# Patient Record
Sex: Female | Born: 1975 | Race: White | Hispanic: No | Marital: Single | Smoking: Current every day smoker
Health system: Southern US, Community
[De-identification: ages and names within clinical notes are randomized; demographics above are authoritative.]

## PROBLEM LIST (undated history)

## (undated) DIAGNOSIS — M5030 Other cervical disc degeneration, unspecified cervical region: Secondary | ICD-10-CM

## (undated) DIAGNOSIS — K760 Fatty (change of) liver, not elsewhere classified: Secondary | ICD-10-CM

## (undated) DIAGNOSIS — F32A Depression, unspecified: Secondary | ICD-10-CM

## (undated) DIAGNOSIS — E785 Hyperlipidemia, unspecified: Secondary | ICD-10-CM

## (undated) DIAGNOSIS — R7303 Prediabetes: Secondary | ICD-10-CM

## (undated) DIAGNOSIS — F419 Anxiety disorder, unspecified: Secondary | ICD-10-CM

## (undated) DIAGNOSIS — F259 Schizoaffective disorder, unspecified: Secondary | ICD-10-CM

## (undated) DIAGNOSIS — F319 Bipolar disorder, unspecified: Secondary | ICD-10-CM

## (undated) DIAGNOSIS — Z62819 Personal history of unspecified abuse in childhood: Secondary | ICD-10-CM

## (undated) DIAGNOSIS — F17200 Nicotine dependence, unspecified, uncomplicated: Secondary | ICD-10-CM

## (undated) HISTORY — PX: TONSILLECTOMY: SUR1361

## (undated) HISTORY — PX: ABDOMINAL HYSTERECTOMY: SHX81

## (undated) HISTORY — PX: WISDOM TOOTH EXTRACTION: SHX21

## (undated) HISTORY — PX: KNEE ARTHROSCOPY: SHX127

## (undated) HISTORY — PX: DILATION AND CURETTAGE OF UTERUS: SHX78

## (undated) HISTORY — PX: TRIGGER FINGER RELEASE: SHX641

---

## 2002-01-01 ENCOUNTER — Emergency Department (HOSPITAL_COMMUNITY): Admission: EM | Admit: 2002-01-01 | Discharge: 2002-01-01 | Payer: Self-pay | Admitting: Emergency Medicine

## 2009-10-19 ENCOUNTER — Encounter: Payer: Self-pay | Admitting: Obstetrics and Gynecology

## 2009-10-19 ENCOUNTER — Ambulatory Visit: Payer: Self-pay | Admitting: Obstetrics & Gynecology

## 2009-10-19 LAB — CONVERTED CEMR LAB
HCV Ab: NEGATIVE
Hemoglobin: 14.9 g/dL (ref 12.0–15.0)
Hepatitis B Surface Ag: NEGATIVE
MCHC: 33.9 g/dL (ref 30.0–36.0)
Pap Smear: NEGATIVE
RBC: 4.91 M/uL (ref 3.87–5.11)
WBC: 8.9 10*3/uL (ref 4.0–10.5)

## 2009-10-20 ENCOUNTER — Ambulatory Visit (HOSPITAL_COMMUNITY): Admission: RE | Admit: 2009-10-20 | Discharge: 2009-10-20 | Payer: Self-pay | Admitting: Obstetrics & Gynecology

## 2010-04-25 ENCOUNTER — Emergency Department (HOSPITAL_COMMUNITY): Admission: EM | Admit: 2010-04-25 | Discharge: 2010-04-25 | Payer: Self-pay | Admitting: Emergency Medicine

## 2010-04-25 ENCOUNTER — Ambulatory Visit (HOSPITAL_COMMUNITY): Admission: RE | Admit: 2010-04-25 | Discharge: 2010-04-25 | Payer: Self-pay | Admitting: Obstetrics & Gynecology

## 2010-05-09 ENCOUNTER — Encounter: Admission: RE | Admit: 2010-05-09 | Discharge: 2010-05-09 | Payer: Self-pay | Admitting: Obstetrics & Gynecology

## 2010-11-05 LAB — POCT URINALYSIS DIP (DEVICE)
Glucose, UA: NEGATIVE mg/dL
Nitrite: NEGATIVE
Protein, ur: NEGATIVE mg/dL
Specific Gravity, Urine: 1.02 (ref 1.005–1.030)
Urobilinogen, UA: 0.2 mg/dL (ref 0.0–1.0)

## 2011-12-17 IMAGING — US US TRANSVAGINAL NON-OB
1 series · 13 of 25 positions shown · non-contrast
Comparison: 09/04/2009

CLINICAL DATA: Follow-up complex ovarian cyst seen on prior exam.

TRANSVAGINAL ULTRASOUND OF PELVIS
TECHNIQUE: Transvaginal ultrasound examination of the pelvis was
performed including evaluation of the uterus, ovaries, adnexal
regions, and pelvic cul-de-sac.

[Series 1: us transvaginal non-ob · 0.12mm/px · 48 acquisitions, 13 frames shown]
[im 1/48]
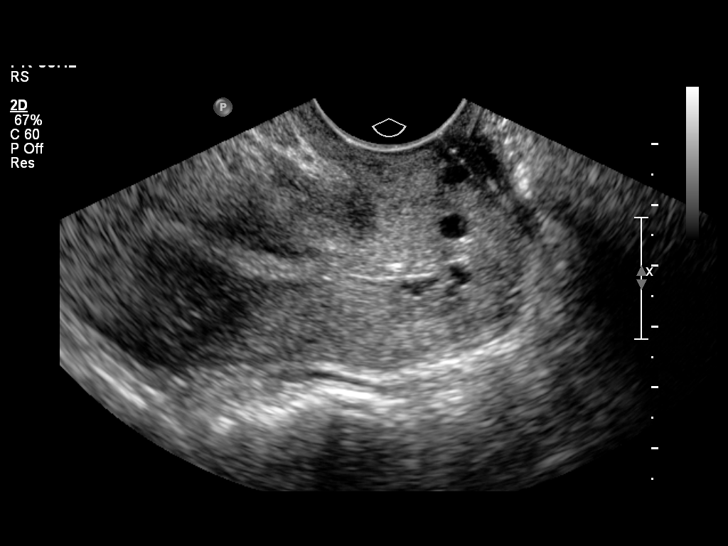
[im 4/48]
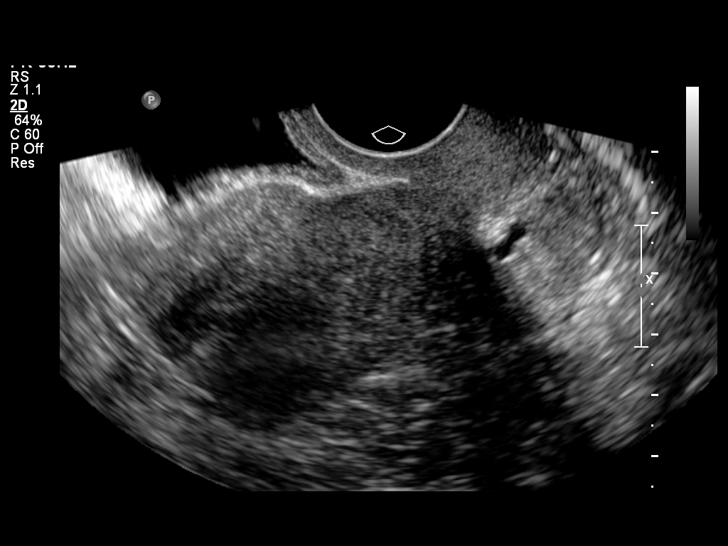
[im 8/48]
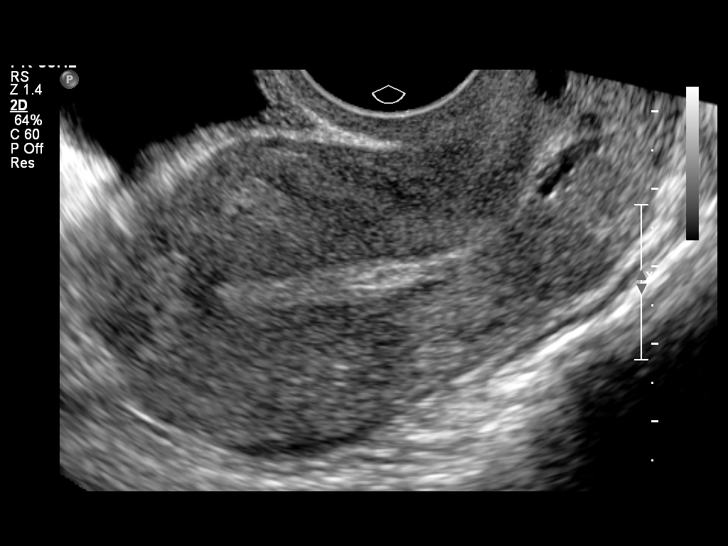
[im 12/48]
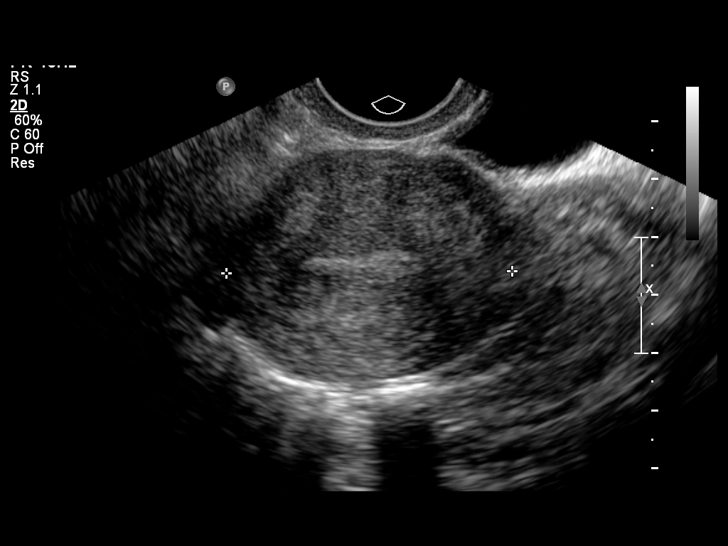
[im 16/48]
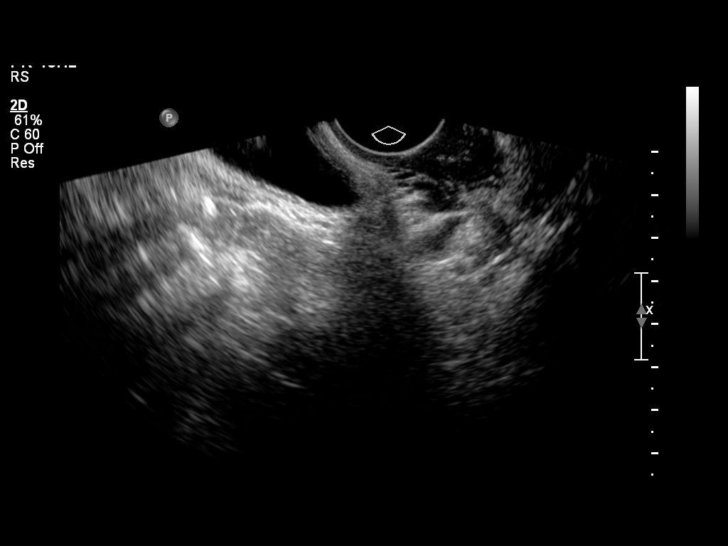
[im 20/48]
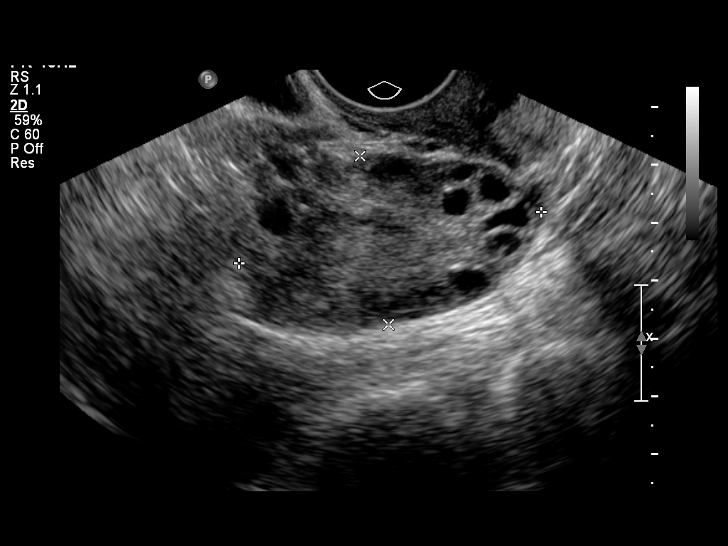
[im 24/48]
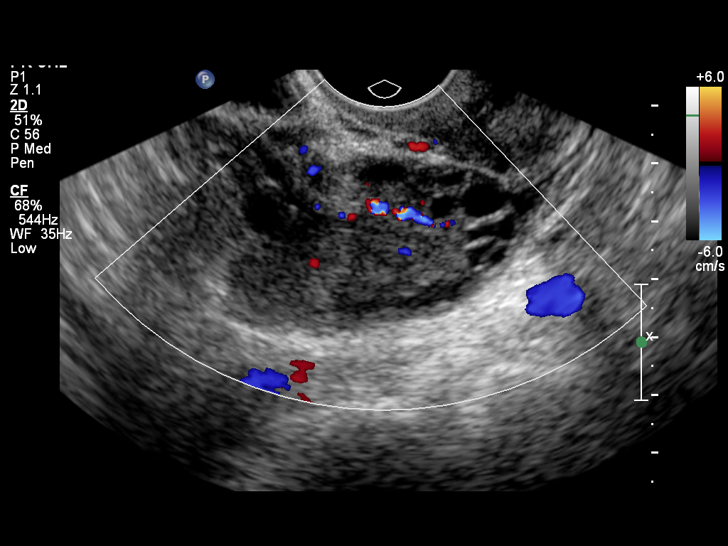
[im 28/48]
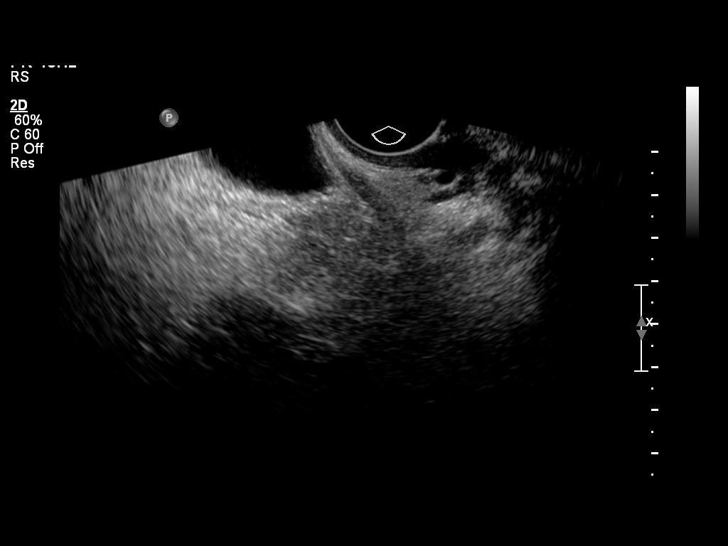
[im 32/48]
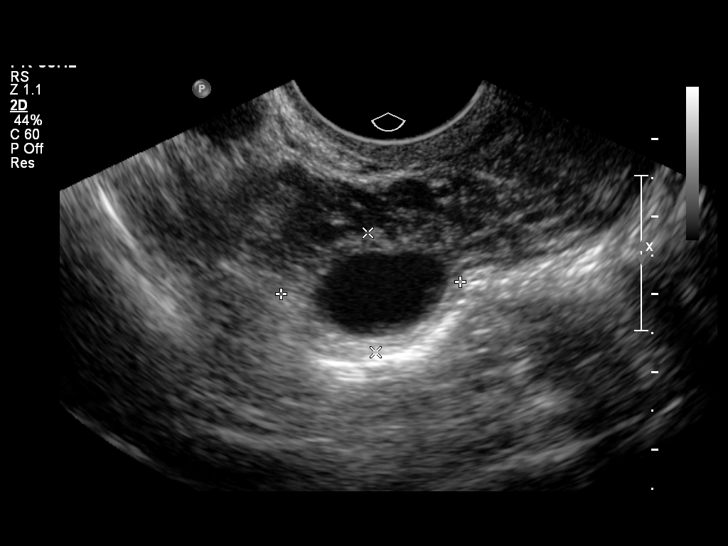
[im 36/48]
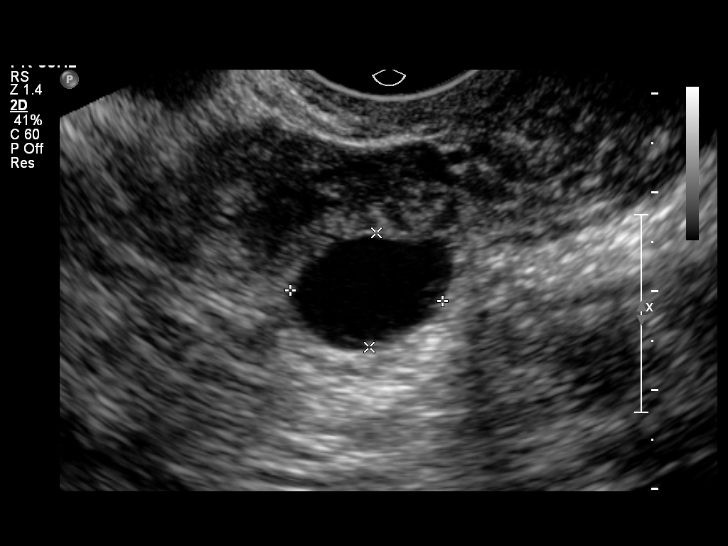
[im 40/48]
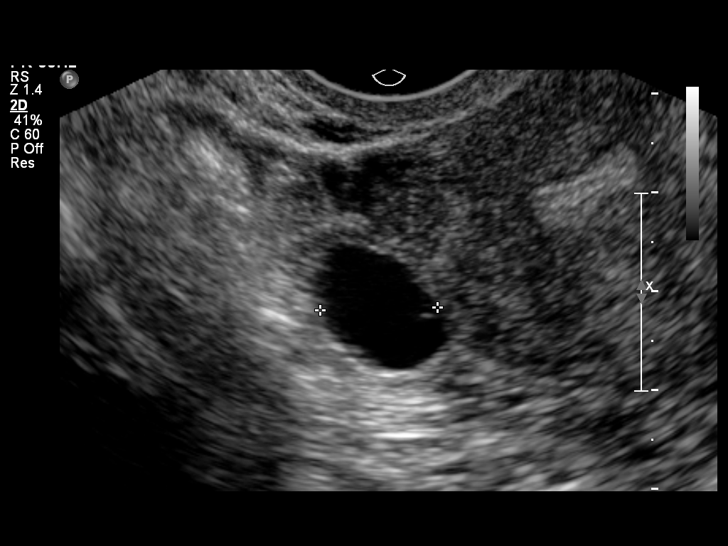
[im 44/48]
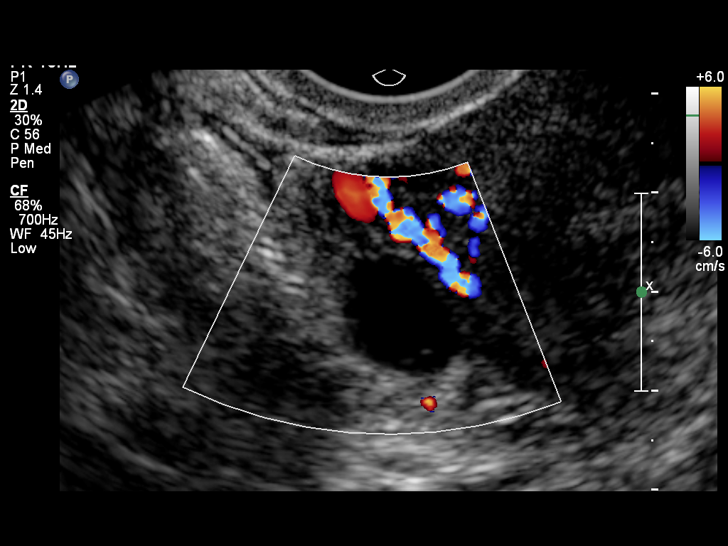
[im 48/48]
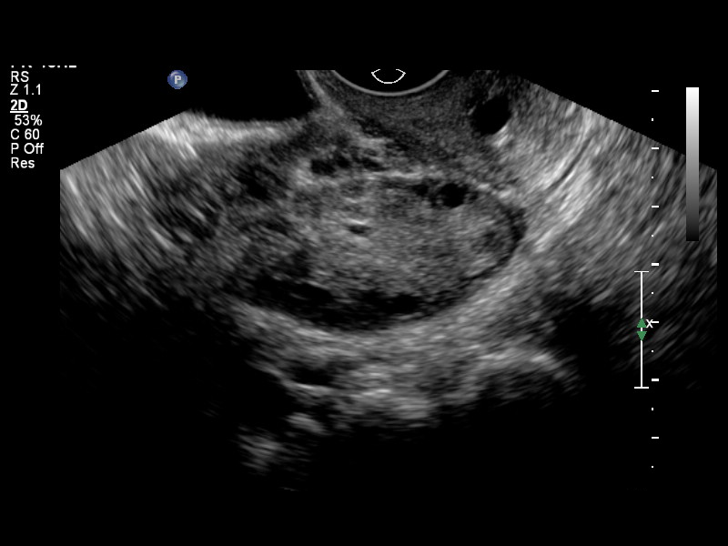

[13 of 25 positions shown; findings below may reference images not displayed]

FINDINGS: Uterus demonstrates a sagittal length of 7.6 cm, an AP width of
cm and a transverse width of 4.9 cm.  A homogeneous uterine
myometrium is noted.

Endometrium  is thin and echogenic with an AP width of 3.2 mm.  No
areas of focal thickening or inhomogeneity are seen and this would
correlate with a postsecretory endometrial stripe and correspond
with the patient's given LMP of 10/16/2009

Right Ovary is not seen with confidence

Left Ovary measures 5.3 x 3.0 x 3.4 cm and contains multiple small
follicles scattered throughout the parenchyma.  The previously
noted complex cyst on this side has resolved compatible with
interval resolution of a hemorrhagic cyst.

Other Findings:  A unilocular right adnexal cystic structure is
noted measuring 1.5 x 1.2 x 1.2 cm.  This has an incomplete
septation suggesting this may represent a dilated tube on this
side.  The normal right ovary is not visualized on this exam.
IMPRESSION: Normal uterine myometrium and endometrium.  Interval resolution of
a small complex left ovarian cyst compatible with resolution of a
functional hemorrhagic cyst since the prior exam.  It should be
noted that on the prior exam, the ovarian lesion was stated to be
on the right but was actually located on the left.  That study has
been addended to correct the report.

Non-visualized right ovary.  Unilocular right adnexal cyst
unchanged from prior exam and likely representing a dilated portion
of the fallopian tube.

## 2014-05-31 ENCOUNTER — Emergency Department (HOSPITAL_COMMUNITY)
Admission: EM | Admit: 2014-05-31 | Discharge: 2014-05-31 | Disposition: A | Discharge status: Home / Self Care | Payer: Self-pay | Attending: Emergency Medicine | Admitting: Emergency Medicine

## 2014-05-31 ENCOUNTER — Emergency Department (HOSPITAL_COMMUNITY): Payer: Self-pay

## 2014-05-31 ENCOUNTER — Encounter (HOSPITAL_COMMUNITY): Payer: Self-pay | Admitting: Emergency Medicine

## 2014-05-31 DIAGNOSIS — R0789 Other chest pain: Secondary | ICD-10-CM | POA: Insufficient documentation

## 2014-05-31 DIAGNOSIS — Z720 Tobacco use: Secondary | ICD-10-CM | POA: Insufficient documentation

## 2014-05-31 DIAGNOSIS — M549 Dorsalgia, unspecified: Secondary | ICD-10-CM | POA: Insufficient documentation

## 2014-05-31 LAB — I-STAT BETA HCG BLOOD, ED (MC, WL, AP ONLY)

## 2014-05-31 LAB — BASIC METABOLIC PANEL
Anion gap: 9 (ref 5–15)
BUN: 10 mg/dL (ref 6–23)
CO2: 26 meq/L (ref 19–32)
Calcium: 9.2 mg/dL (ref 8.4–10.5)
Chloride: 105 mEq/L (ref 96–112)
Creatinine, Ser: 0.71 mg/dL (ref 0.50–1.10)
GFR calc Af Amer: >90 mL/min (ref >90)
GLUCOSE: 85 mg/dL (ref 70–99)
POTASSIUM: 3.7 meq/L (ref 3.7–5.3)
Sodium: 140 mEq/L (ref 137–147)

## 2014-05-31 LAB — CBC
HEMATOCRIT: 40.6 % (ref 36.0–46.0)
HEMOGLOBIN: 14 g/dL (ref 12.0–15.0)
MCH: 30.6 pg (ref 26.0–34.0)
MCHC: 34.5 g/dL (ref 30.0–36.0)
MCV: 88.8 fL (ref 78.0–100.0)
Platelets: 268 10*3/uL (ref 150–400)
RBC: 4.57 MIL/uL (ref 3.87–5.11)
RDW: 12.4 % (ref 11.5–15.5)
WBC: 9.5 10*3/uL (ref 4.0–10.5)

## 2014-05-31 LAB — LIPASE, BLOOD: Lipase: 19 U/L (ref 11–59)

## 2014-05-31 LAB — I-STAT TROPONIN, ED: TROPONIN I, POC: 0 ng/mL (ref 0.00–0.08)

## 2014-05-31 MED ORDER — LORAZEPAM 1 MG PO TABS
1.0000 mg | ORAL_TABLET | Freq: Once | ORAL | Status: AC
  Administered 2014-05-31: 1 mg via ORAL
  Filled 2014-05-31: qty 1

## 2014-05-31 MED ORDER — KETOROLAC TROMETHAMINE 60 MG/2ML IM SOLN
60.0000 mg | Freq: Once | INTRAMUSCULAR | Status: AC
  Administered 2014-05-31: 60 mg via INTRAMUSCULAR
  Filled 2014-05-31: qty 2

## 2014-05-31 MED ORDER — HYDROCODONE-ACETAMINOPHEN 5-325 MG PO TABS: 2.0000 | ORAL_TABLET | Freq: Four times a day (QID) | ORAL | Status: DC | PRN

## 2014-05-31 MED ORDER — LORAZEPAM 2 MG/ML IJ SOLN
1.0000 mg | Freq: Once | INTRAMUSCULAR | Status: DC
  Filled 2014-05-31: qty 1

## 2014-05-31 NOTE — ED Provider Notes (Signed)
I saw and evaluated the patient, reviewed the resident's note and I agree with the findings and plan.   EKG Interpretation   Date/Time:  Tuesday May 31 2014 15:40:19 EDT Ventricular Rate:  75 PR Interval:  156 QRS Duration: 88 QT Interval:  376 QTC Calculation: 419 R Axis:   69 Text Interpretation:  Normal sinus rhythm Normal ECG Confirmed by Deryck Hippler   MD, Nefertari Rebman ([id number]) on 05/31/2014 8:56:46 PM     Patient here with constant left-sided chest pain x2 weeks that is worse with palpation or movement. Chest x-ray is negative here. Do not think that this represents ACS or PE. Given Toradol and Ativan feels better. Stable for discharge  Toy BakerAnthony T Terris Bodin, MD 05/31/14 2220

## 2014-05-31 NOTE — Discharge Instructions (Signed)

## 2014-05-31 NOTE — ED Provider Notes (Signed)
CSN: [id number]     Arrival date & time 05/31/14  1522 History   First MD Initiated Contact with Patient 05/31/14 2034     Chief Complaint  Patient presents with  . Chest Pain  . Weakness     (Consider location/radiation/quality/duration/timing/severity/associated sxs/prior Treatment) Patient is a 38 y.o. female presenting with chest pain.  Chest Pain Pain location:  L chest Pain quality: sharp   Pain radiates to:  Does not radiate Pain radiates to the back: no   Pain severity:  Severe Onset quality:  Gradual Duration:  2 weeks Timing:  Intermittent Progression:  Waxing and waning Chronicity:  New Context: raising an arm   Relieved by:  None tried Worsened by:  Movement Ineffective treatments:  None tried Associated symptoms: back pain   Associated symptoms: no abdominal pain, no cough, no fever, no headache, no lower extremity edema, no nausea, no shortness of breath and not vomiting   Risk factors: smoking   Risk factors: no birth control, no coronary artery disease, no diabetes mellitus, no high cholesterol, no hypertension, not obese and no prior DVT/PE     History reviewed. No pertinent past medical history. History reviewed. No pertinent past surgical history. No family history on file. History  Substance Use Topics  . Smoking status: Current Every Day Smoker  . Smokeless tobacco: Not on file  . Alcohol Use: No   OB History   Grav Para Term Preterm Abortions TAB SAB Ect Mult Living                 Review of Systems  Constitutional: Negative for fever and chills.  HENT: Negative for congestion and sore throat.   Eyes: Negative for visual disturbance.  Respiratory: Negative for cough, shortness of breath and wheezing.   Cardiovascular: Positive for chest pain. Negative for leg swelling.  Gastrointestinal: Negative for nausea, vomiting, abdominal pain, diarrhea and constipation.  Genitourinary: Negative for dysuria, difficulty urinating and vaginal pain.   Musculoskeletal: Positive for back pain. Negative for arthralgias and myalgias.  Skin: Negative for rash.  Neurological: Negative for syncope and headaches.  Psychiatric/Behavioral: Negative for behavioral problems.  All other systems reviewed and are negative.     Allergies  Review of patient's allergies indicates no known allergies.  Home Medications   Prior to Admission medications   Medication Sig Start Date End Date Taking? Authorizing Provider  HYDROcodone-acetaminophen (NORCO/VICODIN) 5-325 MG per tablet Take 2 tablets by mouth every 6 (six) hours as needed. 05/31/14   Beverely Risenennis Nekayla Heider, MD   BP 112/69  Pulse 54  Temp(Src) 98.4 F (36.9 C) (Oral)  Resp 13  Ht 5\' 3"  (1.6 m)  Wt 170 lb 14.4 oz (77.52 kg)  BMI 30.28 kg/m2  SpO2 100%  LMP 05/17/2014 Physical Exam  Vitals reviewed. Constitutional: She is oriented to person, place, and time. She appears well-developed and well-nourished. No distress.  HENT:  Head: Normocephalic and atraumatic.  Eyes: EOM are normal.  Neck: Normal range of motion.  Cardiovascular: Normal rate, regular rhythm and normal heart sounds.   No murmur heard. Pulmonary/Chest: Effort normal and breath sounds normal. No respiratory distress. She has no wheezes. She exhibits tenderness.  Abdominal: Soft. There is no tenderness.  Musculoskeletal: She exhibits no edema.  Neurological: She is alert and oriented to person, place, and time.  Skin: She is not diaphoretic.  Psychiatric: She has a normal mood and affect. Her behavior is normal.    ED Course  Procedures (including critical care time)  Labs Review Labs Reviewed  CBC  BASIC METABOLIC PANEL  LIPASE, BLOOD  I-STAT TROPOININ, ED  I-STAT BETA HCG BLOOD, ED ([facility], [facility], [facility] ONLY)    Imaging Review Dg Chest 2 View  05/31/2014   CLINICAL DATA:  Two week history of upper chest and abdominal discomfort and bloating; history of collapsed lung in 1999; current tobacco use  EXAM: CHEST  2 VIEW   COMPARISON:  PA and lateral chest x-ray of October 28, 2013.  FINDINGS: The lungs are adequately inflated and clear. The heart and mediastinal structures are normal. The pulmonary vascularity is not engorged. There is no pleural effusion or pneumothorax. The bony thorax is unremarkable.  IMPRESSION: There is no acute cardiopulmonary abnormality.   Electronically Signed   By: David  Swazil[place]   On: 05/31/2014 16:47     EKG Interpretation   Date/Time:  Tuesday May 31 2014 15:40:19 EDT Ventricular Rate:  75 PR Interval:  156 QRS Duration: 88 QT Interval:  376 QTC Calculation: 419 R Axis:   69 Text Interpretation:  Normal sinus rhythm Normal ECG Confirmed by ALLEN   MD, ANTHONY ([id number]) on 05/31/2014 8:56:46 PM      MDM   Final diagnoses:  Chest wall pain    Patient is a 38 year old female that presents with 2 weeks of sharp left-sided chest pain. On exam the patient's pain is reproduced with palpation. Patient's workup is unremarkable with a negative EKG, negative troponin, negative chest x-ray. Patient is Wells low risk and perc negative. Patient had complete resolution of pain with Toradol and Ativan. Patient will be given a short course of pain medications to go home with and information to establish with a PCP.    Beverely Risenennis Jazzmyne Rasnick, MD 05/31/14 2211

## 2014-05-31 NOTE — ED Notes (Signed)
Pt presents with left sided chest pain for about 2 weeks, pain is worse with palpation.  Pt also admits to productive cough and generalized body aches.  Pt tearful in triage- states "I feel like I'm swelling".  Reparations e/u.

## 2014-06-03 NOTE — ED Provider Notes (Signed)
I saw and evaluated the patient, reviewed the resident's note and I agree with the findings and plan.   EKG Interpretation   Date/Time:  Tuesday May 31 2014 15:40:19 EDT Ventricular Rate:  75 PR Interval:  156 QRS Duration: 88 QT Interval:  376 QTC Calculation: 419 R Axis:   69 Text Interpretation:  Normal sinus rhythm Normal ECG Confirmed by Freida BusmanALLEN   MD, Demarkis Gheen ([id number]) on 05/31/2014 8:56:46 PM       Toy BakerAnthony T Zakayla Martinec, MD 06/03/14 1146

## 2016-07-27 IMAGING — CR DG CHEST 2V
2 series · 2 of 2 positions shown · non-contrast
Comparison: PA and lateral chest x-ray October 28, 2013.

CLINICAL DATA: Two week history of upper chest and abdominal
discomfort and bloating; history of collapsed lung in 3111; current
tobacco use

EXAM:
CHEST  2 VIEW

[w chest pa]
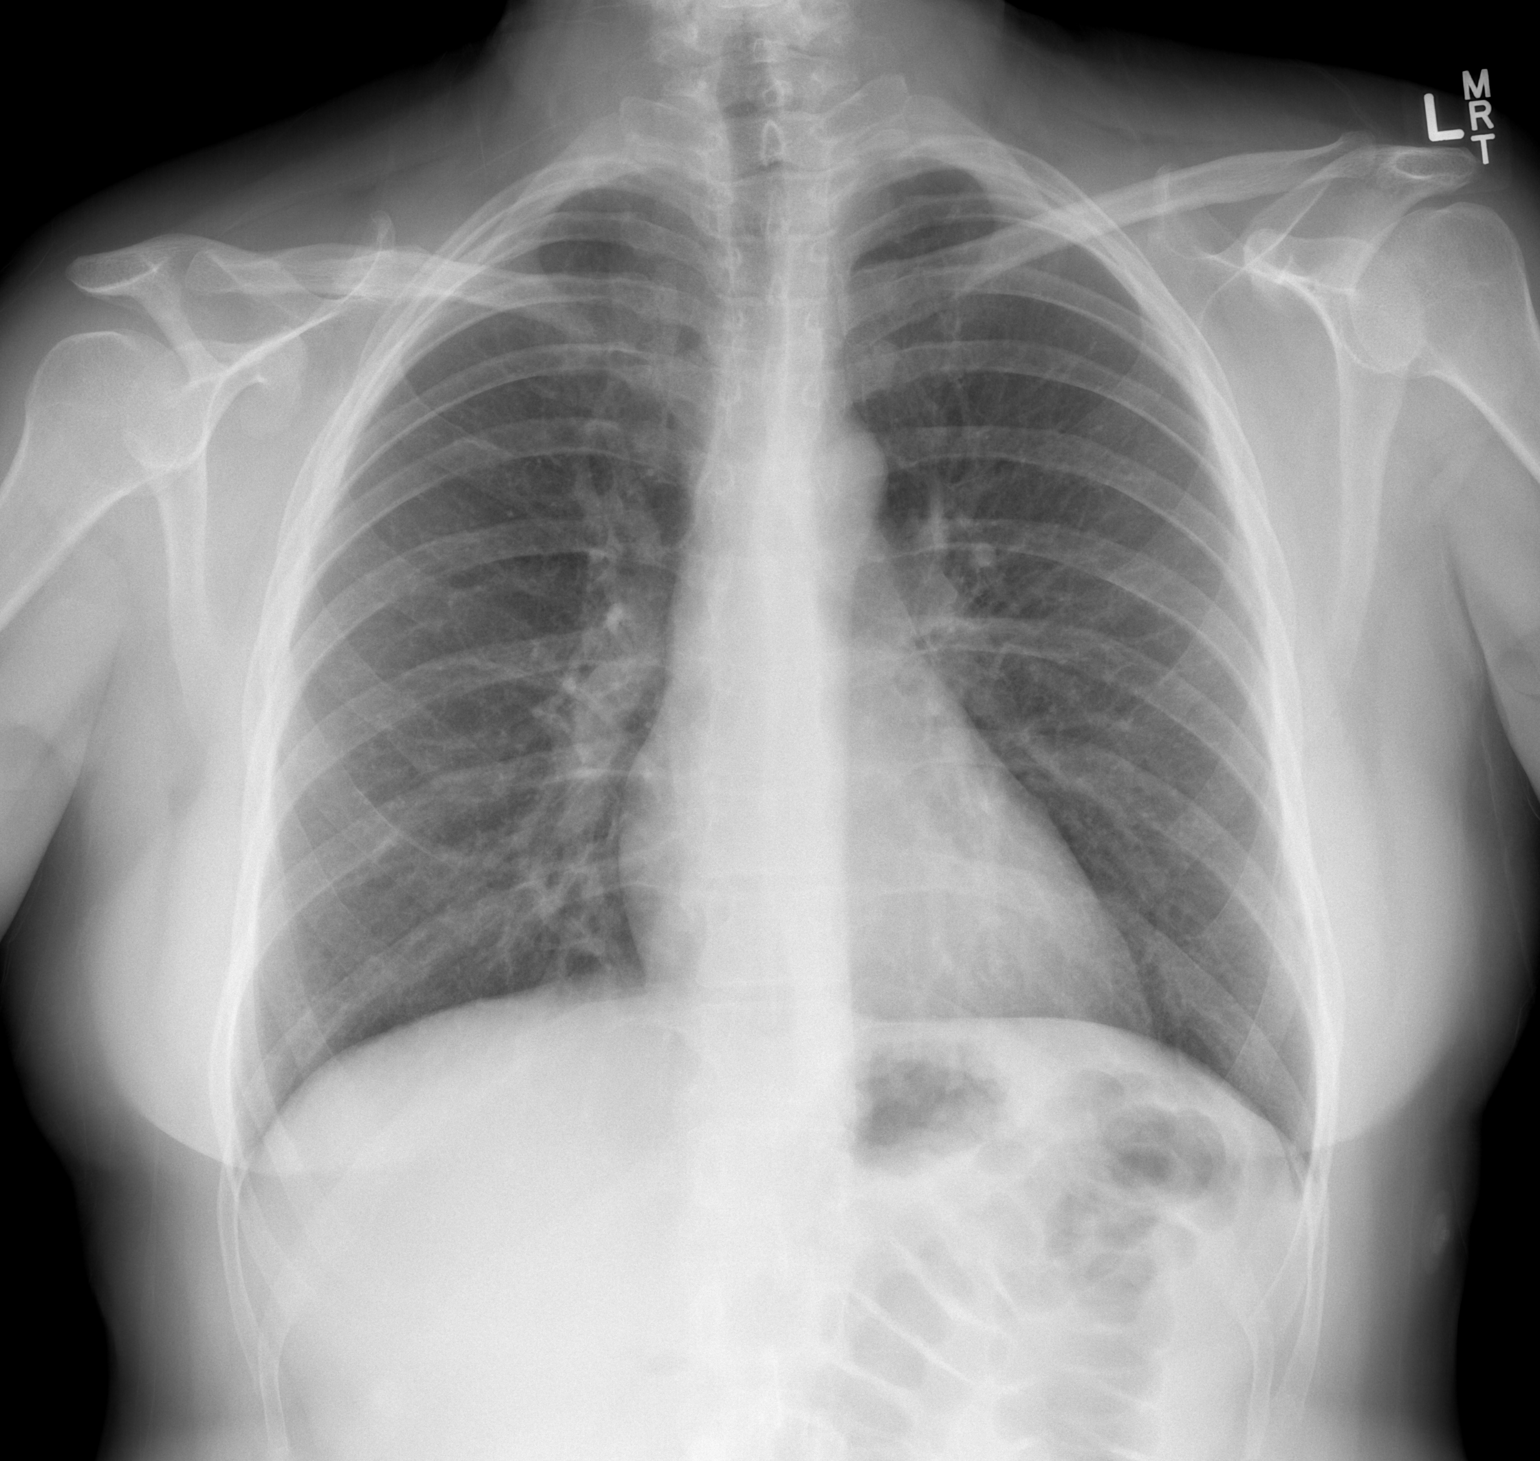

[w chest lat]
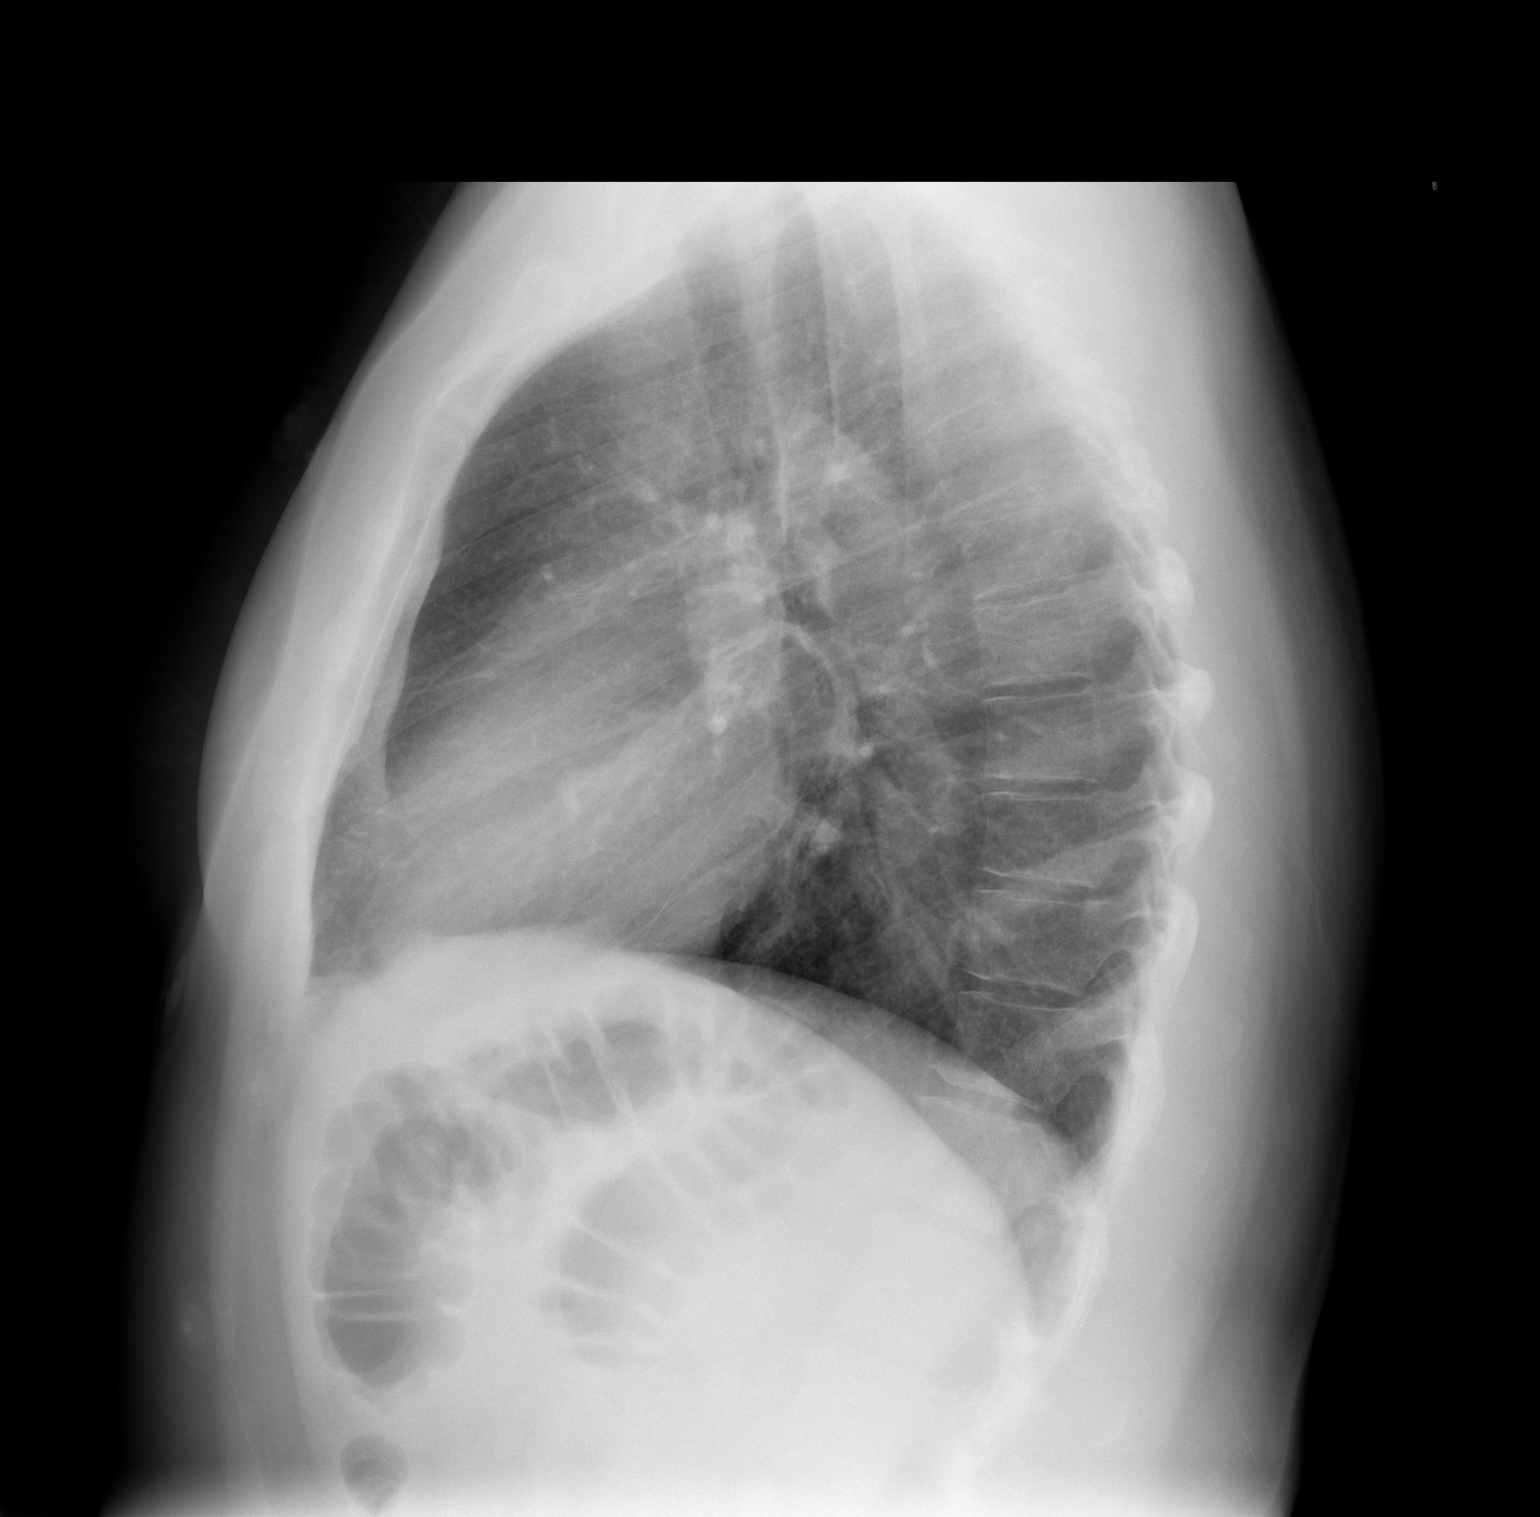

[2 of 2 positions shown; findings below may reference images not displayed]

FINDINGS: The lungs are adequately inflated and clear. The heart and
mediastinal structures are normal. The pulmonary vascularity is not
engorged. There is no pleural effusion or pneumothorax. The bony
thorax is unremarkable.
IMPRESSION: There is no acute cardiopulmonary abnormality.

## 2022-12-23 ENCOUNTER — Encounter (HOSPITAL_BASED_OUTPATIENT_CLINIC_OR_DEPARTMENT_OTHER): Payer: Self-pay | Admitting: Orthopedic Surgery

## 2022-12-23 ENCOUNTER — Other Ambulatory Visit: Payer: Self-pay

## 2022-12-27 ENCOUNTER — Ambulatory Visit (HOSPITAL_BASED_OUTPATIENT_CLINIC_OR_DEPARTMENT_OTHER)
Admission: RE | Admit: 2022-12-27 | Discharge: 2022-12-27 | Disposition: A | Discharge status: Home / Self Care | Payer: Medicaid Other | Source: Ambulatory Visit | Attending: Orthopedic Surgery | Admitting: Orthopedic Surgery

## 2022-12-27 ENCOUNTER — Ambulatory Visit (HOSPITAL_BASED_OUTPATIENT_CLINIC_OR_DEPARTMENT_OTHER): Payer: Medicaid Other | Admitting: Certified Registered"

## 2022-12-27 ENCOUNTER — Other Ambulatory Visit: Payer: Self-pay

## 2022-12-27 ENCOUNTER — Encounter (HOSPITAL_BASED_OUTPATIENT_CLINIC_OR_DEPARTMENT_OTHER)
Admission: RE | Disposition: A | Discharge status: Home / Self Care | Payer: Self-pay | Source: Ambulatory Visit | Attending: Orthopedic Surgery

## 2022-12-27 ENCOUNTER — Encounter (HOSPITAL_BASED_OUTPATIENT_CLINIC_OR_DEPARTMENT_OTHER): Payer: Self-pay | Admitting: Orthopedic Surgery

## 2022-12-27 DIAGNOSIS — M94262 Chondromalacia, left knee: Secondary | ICD-10-CM | POA: Diagnosis not present

## 2022-12-27 DIAGNOSIS — S83232A Complex tear of medial meniscus, current injury, left knee, initial encounter: Secondary | ICD-10-CM

## 2022-12-27 DIAGNOSIS — F17200 Nicotine dependence, unspecified, uncomplicated: Secondary | ICD-10-CM

## 2022-12-27 DIAGNOSIS — F319 Bipolar disorder, unspecified: Secondary | ICD-10-CM | POA: Diagnosis not present

## 2022-12-27 DIAGNOSIS — F418 Other specified anxiety disorders: Secondary | ICD-10-CM

## 2022-12-27 DIAGNOSIS — F419 Anxiety disorder, unspecified: Secondary | ICD-10-CM | POA: Diagnosis not present

## 2022-12-27 DIAGNOSIS — F17210 Nicotine dependence, cigarettes, uncomplicated: Secondary | ICD-10-CM | POA: Insufficient documentation

## 2022-12-27 HISTORY — DX: Depression, unspecified: F32.A

## 2022-12-27 HISTORY — DX: Hyperlipidemia, unspecified: E78.5

## 2022-12-27 HISTORY — DX: Bipolar disorder, unspecified: F31.9

## 2022-12-27 HISTORY — DX: Other cervical disc degeneration, unspecified cervical region: M50.30

## 2022-12-27 HISTORY — DX: Personal history of unspecified abuse in childhood: Z62.819

## 2022-12-27 HISTORY — DX: Prediabetes: R73.03

## 2022-12-27 HISTORY — PX: KNEE ARTHROSCOPY WITH MEDIAL MENISECTOMY: SHX5651

## 2022-12-27 HISTORY — DX: Nicotine dependence, unspecified, uncomplicated: F17.200

## 2022-12-27 HISTORY — DX: Schizoaffective disorder, unspecified: F25.9

## 2022-12-27 HISTORY — DX: Fatty (change of) liver, not elsewhere classified: K76.0

## 2022-12-27 HISTORY — DX: Anxiety disorder, unspecified: F41.9

## 2022-12-27 SURGERY — ARTHROSCOPY, KNEE, WITH MEDIAL MENISCECTOMY
Anesthesia: General | Site: Knee | Laterality: Left

## 2022-12-27 MED ORDER — EPHEDRINE SULFATE (PRESSORS) 50 MG/ML IJ SOLN
INTRAMUSCULAR | Status: DC | PRN
  Administered 2022-12-27 (×2): 5 mg via INTRAVENOUS

## 2022-12-27 MED ORDER — LIDOCAINE 2% (20 MG/ML) 5 ML SYRINGE
INTRAMUSCULAR | Status: AC
  Filled 2022-12-27: qty 5

## 2022-12-27 MED ORDER — MIDAZOLAM HCL 2 MG/2ML IJ SOLN
INTRAMUSCULAR | Status: AC
  Filled 2022-12-27: qty 2

## 2022-12-27 MED ORDER — HYDROMORPHONE HCL 1 MG/ML IJ SOLN
0.2500 mg | INTRAMUSCULAR | Status: DC | PRN
  Administered 2022-12-27: 0.5 mg via INTRAVENOUS

## 2022-12-27 MED ORDER — ONDANSETRON HCL 4 MG/2ML IJ SOLN: 4.0000 mg | Freq: Once | INTRAMUSCULAR | Status: DC | PRN

## 2022-12-27 MED ORDER — PROPOFOL 10 MG/ML IV BOLUS
INTRAVENOUS | Status: DC | PRN
  Administered 2022-12-27: 150 mg via INTRAVENOUS

## 2022-12-27 MED ORDER — ACETAMINOPHEN 10 MG/ML IV SOLN
INTRAVENOUS | Status: AC
  Filled 2022-12-27: qty 100

## 2022-12-27 MED ORDER — CEFAZOLIN SODIUM-DEXTROSE 2-4 GM/100ML-% IV SOLN
2.0000 g | INTRAVENOUS | Status: AC
  Administered 2022-12-27: 2 g via INTRAVENOUS

## 2022-12-27 MED ORDER — FENTANYL CITRATE (PF) 100 MCG/2ML IJ SOLN
INTRAMUSCULAR | Status: AC
  Filled 2022-12-27: qty 2

## 2022-12-27 MED ORDER — OXYCODONE HCL 5 MG/5ML PO SOLN: 5.0000 mg | Freq: Once | ORAL | Status: DC | PRN

## 2022-12-27 MED ORDER — ONDANSETRON HCL 4 MG/2ML IJ SOLN
INTRAMUSCULAR | Status: AC
  Filled 2022-12-27: qty 2

## 2022-12-27 MED ORDER — HYDROMORPHONE HCL 1 MG/ML IJ SOLN
INTRAMUSCULAR | Status: AC
  Filled 2022-12-27: qty 0.5

## 2022-12-27 MED ORDER — DEXAMETHASONE SODIUM PHOSPHATE 10 MG/ML IJ SOLN
INTRAMUSCULAR | Status: AC
  Filled 2022-12-27: qty 1

## 2022-12-27 MED ORDER — CEFAZOLIN SODIUM-DEXTROSE 2-4 GM/100ML-% IV SOLN
INTRAVENOUS | Status: AC
  Filled 2022-12-27: qty 100

## 2022-12-27 MED ORDER — EPHEDRINE 5 MG/ML INJ
INTRAVENOUS | Status: AC
  Filled 2022-12-27: qty 5

## 2022-12-27 MED ORDER — DEXAMETHASONE SODIUM PHOSPHATE 10 MG/ML IJ SOLN
INTRAMUSCULAR | Status: DC | PRN
Start: 2022-12-27 — End: 2022-12-27
  Administered 2022-12-27: 10 mg via INTRAVENOUS

## 2022-12-27 MED ORDER — OXYCODONE HCL 5 MG PO TABS: 5.0000 mg | ORAL_TABLET | Freq: Once | ORAL | Status: DC | PRN

## 2022-12-27 MED ORDER — BUPIVACAINE HCL 0.25 % IJ SOLN
INTRAMUSCULAR | Status: DC | PRN
  Administered 2022-12-27: 14 mL

## 2022-12-27 MED ORDER — PROPOFOL 10 MG/ML IV BOLUS
INTRAVENOUS | Status: AC
  Filled 2022-12-27: qty 20

## 2022-12-27 MED ORDER — ONDANSETRON HCL 4 MG/2ML IJ SOLN
INTRAMUSCULAR | Status: DC | PRN
  Administered 2022-12-27: 4 mg via INTRAVENOUS

## 2022-12-27 MED ORDER — KETOROLAC TROMETHAMINE 30 MG/ML IJ SOLN
30.0000 mg | Freq: Once | INTRAMUSCULAR | Status: AC | PRN
  Administered 2022-12-27: 30 mg via INTRAVENOUS

## 2022-12-27 MED ORDER — LIDOCAINE HCL (CARDIAC) PF 100 MG/5ML IV SOSY
PREFILLED_SYRINGE | INTRAVENOUS | Status: DC | PRN
  Administered 2022-12-27: 60 mg via INTRAVENOUS

## 2022-12-27 MED ORDER — SODIUM CHLORIDE 0.9 % IR SOLN
Status: DC | PRN
  Administered 2022-12-27: 6000 mL

## 2022-12-27 MED ORDER — MEPERIDINE HCL 25 MG/ML IJ SOLN: 6.2500 mg | INTRAMUSCULAR | Status: DC | PRN

## 2022-12-27 MED ORDER — LACTATED RINGERS IV SOLN: INTRAVENOUS | Status: DC

## 2022-12-27 MED ORDER — ONDANSETRON HCL 4 MG PO TABS: 4.0000 mg | ORAL_TABLET | Freq: Three times a day (TID) | ORAL | 0 refills | Status: AC | PRN

## 2022-12-27 MED ORDER — FENTANYL CITRATE (PF) 100 MCG/2ML IJ SOLN
INTRAMUSCULAR | Status: DC | PRN
  Administered 2022-12-27: 50 ug via INTRAVENOUS
  Administered 2022-12-27 (×2): 25 ug via INTRAVENOUS

## 2022-12-27 MED ORDER — BUPIVACAINE HCL (PF) 0.25 % IJ SOLN
INTRAMUSCULAR | Status: AC
  Filled 2022-12-27: qty 30

## 2022-12-27 MED ORDER — ACETAMINOPHEN 10 MG/ML IV SOLN
INTRAVENOUS | Status: DC | PRN
  Administered 2022-12-27: 1000 mg via INTRAVENOUS

## 2022-12-27 MED ORDER — KETOROLAC TROMETHAMINE 30 MG/ML IJ SOLN
INTRAMUSCULAR | Status: AC
  Filled 2022-12-27: qty 1

## 2022-12-27 MED ORDER — MIDAZOLAM HCL 5 MG/5ML IJ SOLN
INTRAMUSCULAR | Status: DC | PRN
Start: 2022-12-27 — End: 2022-12-27
  Administered 2022-12-27: 2 mg via INTRAVENOUS

## 2022-12-27 MED ORDER — HYDROCODONE-ACETAMINOPHEN 5-325 MG PO TABS: 1.0000 | ORAL_TABLET | ORAL | 0 refills | Status: AC | PRN

## 2022-12-27 MED ORDER — AMISULPRIDE (ANTIEMETIC) 5 MG/2ML IV SOLN: 10.0000 mg | Freq: Once | INTRAVENOUS | Status: DC | PRN

## 2022-12-27 SURGICAL SUPPLY — 36 items
BANDAGE ESMARK 6X9 LF (GAUZE/BANDAGES/DRESSINGS) IMPLANT
BLADE SHAVER TORPEDO 4X13 (MISCELLANEOUS) ×1 IMPLANT
BNDG CMPR 5X62 HK CLSR LF (GAUZE/BANDAGES/DRESSINGS) ×1
BNDG CMPR 6"X 5 YARDS HK CLSR (GAUZE/BANDAGES/DRESSINGS) ×1
BNDG CMPR 9X6 STRL LF SNTH (GAUZE/BANDAGES/DRESSINGS)
BNDG ELASTIC 6INX 5YD STR LF (GAUZE/BANDAGES/DRESSINGS) ×1 IMPLANT
BNDG ESMARK 6X9 LF (GAUZE/BANDAGES/DRESSINGS)
BURR OVAL 8 FLU 4.0X13 (MISCELLANEOUS) IMPLANT
BURR OVAL 8 FLU 5.0X13 (MISCELLANEOUS) IMPLANT
CLSR STERI-STRIP ANTIMIC 1/2X4 (GAUZE/BANDAGES/DRESSINGS) ×1 IMPLANT
CUFF TOURN SGL QUICK 34 (TOURNIQUET CUFF)
CUFF TRNQT CYL 34X4.125X (TOURNIQUET CUFF) IMPLANT
CUTTER BONE 4.0MM X 13CM (MISCELLANEOUS) IMPLANT
DRAPE INCISE IOBAN 66X45 STRL (DRAPES) IMPLANT
DRAPE U-SHAPE 47X51 STRL (DRAPES) ×1 IMPLANT
DRAPE-T ARTHROSCOPY W/POUCH (DRAPES) ×1 IMPLANT
DURAPREP 26ML APPLICATOR (WOUND CARE) ×1 IMPLANT
ELECT REM PT RETURN 9FT ADLT (ELECTROSURGICAL)
ELECTRODE REM PT RTRN 9FT ADLT (ELECTROSURGICAL) IMPLANT
GAUZE PAD ABD 8X10 STRL (GAUZE/BANDAGES/DRESSINGS) ×1 IMPLANT
GAUZE SPONGE 4X4 12PLY STRL (GAUZE/BANDAGES/DRESSINGS) ×1 IMPLANT
GAUZE XEROFORM 1X8 LF (GAUZE/BANDAGES/DRESSINGS) ×1 IMPLANT
GLOVE BIO SURGEON STRL SZ7.5 (GLOVE) ×2 IMPLANT
GLOVE BIOGEL PI IND STRL 8 (GLOVE) ×2 IMPLANT
GOWN STRL REUS W/ TWL XL LVL3 (GOWN DISPOSABLE) ×1 IMPLANT
GOWN STRL REUS W/TWL XL LVL3 (GOWN DISPOSABLE) ×2 IMPLANT
MANIFOLD NEPTUNE II (INSTRUMENTS) ×1 IMPLANT
PACK ARTHROSCOPY DSU (CUSTOM PROCEDURE TRAY) ×1 IMPLANT
PACK BASIN DAY SURGERY FS (CUSTOM PROCEDURE TRAY) ×1 IMPLANT
PORT APPOLLO RF 90DEGREE MULTI (SURGICAL WAND) IMPLANT
SHEET MEDIUM DRAPE 40X70 STRL (DRAPES) ×1 IMPLANT
SUT ETHILON 4 0 PS 2 18 (SUTURE) ×1 IMPLANT
SUT MNCRL AB 3-0 PS2 18 (SUTURE) ×1 IMPLANT
TOWEL GREEN STERILE FF (TOWEL DISPOSABLE) ×1 IMPLANT
TUBE CONNECTING 20X1/4 (TUBING) IMPLANT
TUBING ARTHROSCOPY IRRIG 16FT (MISCELLANEOUS) ×1 IMPLANT
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
## 2022-12-27 NOTE — Transfer of Care (Signed)
Immediate Anesthesia Transfer of Care Note  Patient: Samantha Mcdaniel  Procedure(s) Performed: KNEE ARTHROSCOPY WITH PARTIAL  MEDIAL MENISECTOMY (Left: Knee)  Patient Location: PACU  Anesthesia Type:General  Level of Consciousness: awake, alert , and oriented  Airway & Oxygen Therapy: Patient Spontanous Breathing and Patient connected to face mask oxygen  Post-op Assessment: Report given to RN and Post -op Vital signs reviewed and stable  Post vital signs: Reviewed and stable  Last Vitals:  Vitals Value Taken Time  BP 106/74 12/27/22 1258  Temp    Pulse 90 12/27/22 1259  Resp 16 12/27/22 1259  SpO2 99 % 12/27/22 1259  Vitals shown include unvalidated device data.  Last Pain:  Vitals:   12/27/22 1130  TempSrc: Temporal  PainSc: 0-No pain         Complications: No notable events documented.

## 2022-12-27 NOTE — Op Note (Addendum)
Surgery Date: 12/27/2022  Surgeon(s): Yolonda Kida, MD  Physician Assistant:  Dion Saucier, PA-C  Assistant attestation:  PA McClung, present for the entire procedure.  ANESTHESIA:  general, and local anesthetic with quarter percent plain Marcaine x 14 cc.  FLUIDS: Per anesthesia record.   ESTIMATED BLOOD LOSS: minimal  PREOPERATIVE DIAGNOSES:  1. Left knee complex medial meniscus tear   POSTOPERATIVE DIAGNOSES:  1. Left knee complex medial meniscus tear 2.  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  Grade III chondromalacia central trochlea.  PROCEDURES PERFORMED:  1.  Left knee arthroscopy with chondroplasty lateral femoral condyle and medial femoral condyle 2.  Left knee arthroscopy with arthroscopic partial medial meniscectomy   DESCRIPTION OF PROCEDURE: Samantha Mcdaniel is a 47 y.o.-year-old female with Left knee medial meniscus tear.  She had a known posterior horn medial meniscus root tear treated with arthroscopic repair at outside facility back in September 2023.  She unfortunately, continued with pain and swelling.  Repeat MRI did show concomitant short radial tear more anterior as well as nonhealing posterior root tear.  Here today for arthroscopic treatment.  Plans are to proceed with partial medial meniscectomy and diagnostic arthroscopy with debridement as indicated. Full discussion held regarding risks benefits alternatives and complications related surgical intervention. Conservative care options reviewed. All questions answered.  The patient was identified in the preoperative holding area and the operative extremity was marked. The patient was brought to the operating room and transferred to operating table in a supine position. Satisfactory general anesthesia was induced by anesthesiology.    Standard anterolateral, anteromedial arthroscopy portals were obtained. The anteromedial portal was obtained with a spinal needle  for localization under direct visualization with subsequent diagnostic findings.   Anteromedial and anterolateral chambers: mild synovitis. The synovitis was debrided with a 4.5 mm full radius shaver through both the anteromedial and lateral portals.   Suprapatellar pouch and gutters: mild synovitis or debris. Patella chondral surface: Grade 1 Trochlear chondral surface: Grade 3 Patellofemoral tracking: Midline level Medial meniscus: There was some scarring noted at the repair site posterior horn at the root.  This was however, not robust tissue and the posterior horn itself was not well healed to the tibial plateau.  It still had the appearance of a nonhealed root tear.  There was also a short radial tear in the white zone at the junction of the mid body and posterior horn..  Medial femoral condyle flexion bearing surface: Grade 4 Medial femoral condyle extension bearing surface: Grade 3 Medial tibial plateau: Grade 1 Anterior cruciate ligament:stable Posterior cruciate ligament:stable Lateral meniscus: Intact Lateral femoral condyle flexion bearing surface: Grade 2 Lateral femoral condyle extension bearing surface: Grade 4 Lateral tibial plateau: Grade 1  Partial medial meniscectomy was carried out combination of motorized shaver as well as meniscal basket biter.  We did resected the posterior horn root tear as well as a short radial tear at the junction mid body posterior horn.  Loose debris was removed with shaver.  After completion of synovectomy, diagnostic exam, and debridements as described, all compartments were checked and no residual debris remained. Hemostasis was achieved with the cautery wand. The portals were approximated with buried monocryl. All excess fluid was expressed from the joint.  Xeroform sterile gauze dressings were applied followed by Ace bandage and ice pack.   DISPOSITION: The patient was awakened from general anesthetic, extubated, taken to the recovery room in  medically stable condition, no apparent complications. The patient may  be weightbearing as tolerated to the operative lower extremity.  Range of motion of  knee as tolerated.  She will be on 81 mg aspirin once per day x 6 weeks for DVT prophylaxis.

## 2022-12-27 NOTE — Anesthesia Procedure Notes (Signed)
Procedure Name: LMA Insertion Date/Time: 12/27/2022 12:22 PM  Performed by: Lauralyn Primes, CRNAPre-anesthesia Checklist: Patient identified, Emergency Drugs available, Suction available and Patient being monitored Patient Re-evaluated:Patient Re-evaluated prior to induction Oxygen Delivery Method: Circle system utilized Preoxygenation: Pre-oxygenation with 100% oxygen Induction Type: IV induction Ventilation: Mask ventilation without difficulty LMA: LMA inserted LMA Size: 4.0 Number of attempts: 1 Airway Equipment and Method: Bite block Placement Confirmation: positive ETCO2 Tube secured with: Tape Dental Injury: Teeth and Oropharynx as per pre-operative assessment

## 2022-12-27 NOTE — Anesthesia Preprocedure Evaluation (Addendum)
Anesthesia Evaluation  Patient identified by MRN, date of birth, ID band Patient awake    Reviewed: Allergy & Precautions, H&P , NPO status , Patient's Chart, lab work & pertinent test results  Airway Mallampati: III  TM Distance: >3 FB Neck ROM: Full    Dental  (+) Dental Advisory Given   Pulmonary Current SmokerPatient did not abstain from smoking. 1/2 ppd, smoked a few on the way in for surgery   Pulmonary exam normal breath sounds clear to auscultation       Cardiovascular negative cardio ROS Normal cardiovascular exam Rhythm:Regular Rate:Normal     Neuro/Psych  PSYCHIATRIC DISORDERS Anxiety Depression Bipolar Disorder Schizophrenia  negative neurological ROS     GI/Hepatic negative GI ROS,,,(+)     substance abuse  marijuana use  Endo/Other  negative endocrine ROS    Renal/GU negative Renal ROS  negative genitourinary   Musculoskeletal negative musculoskeletal ROS (+)    Abdominal   Peds negative pediatric ROS (+)  Hematology negative hematology ROS (+)   Anesthesia Other Findings   Reproductive/Obstetrics negative OB ROS                             Anesthesia Physical Anesthesia Plan  ASA: 2  Anesthesia Plan: General   Post-op Pain Management: Toradol IV (intra-op)*, Ofirmev IV (intra-op)* and Precedex   Induction: Intravenous  PONV Risk Score and Plan: 2 and Ondansetron, Dexamethasone, Midazolam and Treatment may vary due to age or medical condition  Airway Management Planned: LMA  Additional Equipment: None  Intra-op Plan:   Post-operative Plan: Extubation in OR  Informed Consent: I have reviewed the patients History and Physical, chart, labs and discussed the procedure including the risks, benefits and alternatives for the proposed anesthesia with the patient or authorized representative who has indicated his/her understanding and acceptance.     Dental  advisory given  Plan Discussed with: CRNA  Anesthesia Plan Comments:        Anesthesia Quick Evaluation

## 2022-12-27 NOTE — Brief Op Note (Signed)
12/27/2022  12:49 PM  PATIENT:  Marsa Aris  47 y.o. female  PRE-OPERATIVE DIAGNOSIS:  Left knee medial meniscus tear  POST-OPERATIVE DIAGNOSIS:  Left Knee medial meniscus tear Left knee grade III chondromalacia trochlea and posterior surface of patella, grade IV chondromalacia lateral femoral condyle and medial femoral condyle.  PROCEDURE:  Procedure(s) with comments: KNEE ARTHROSCOPY WITH PARTIAL  MEDIAL MENISECTOMY (Left) - 47  SURGEON:  Surgeon(s) and Role:    * Aundria Rud, Noah Delaine, MD - Primary  PHYSICIAN ASSISTANT: Dion Saucier, PA-C   ANESTHESIA:   local and general  EBL:  10 mL   BLOOD ADMINISTERED:none  DRAINS: none   LOCAL MEDICATIONS USED:  MARCAINE     SPECIMEN:  No Specimen  DISPOSITION OF SPECIMEN:  N/A  COUNTS:  YES  TOURNIQUET:  * No tourniquets in log *  DICTATION: .Note written in EPIC  PLAN OF CARE: Discharge to home after PACU  PATIENT DISPOSITION:  PACU - hemodynamically stable.   Delay start of Pharmacological VTE agent (>24hrs) due to surgical blood loss or risk of bleeding: not applicable

## 2022-12-27 NOTE — Discharge Instructions (Addendum)
Post-operative patient instructions  Knee Arthroscopy   Ice:  Place intermittent ice or cooler pack over your knee, 30 minutes on and 30 minutes off.  Continue this for the first 72 hours after surgery, then save ice for use after therapy sessions or on more active days.   Weight:  You may bear weight on your leg as your symptoms allow. DVT prevention: Perform ankle pumps as able throughout the day on the operative extremity.  Be mobile as possible with ambulation as able.  You should also take an 81 mg aspirin once per day x6 weeks. Crutches:  Use crutches (or walker) to assist in walking until told to discontinue by your physical therapist or physician. This will help to reduce pain. Strengthening:  Perform simple thigh squeezes (isometric quad contractions) and straight leg lifts as you are able (3 sets of 5 to 10 repetitions, 3 times a day).  For the leg lifts, have someone support under your ankle in the beginning until you have increased strength enough to do this on your own.  To help get started on thigh squeezes, place a pillow under your knee and push down on the pillow with back of knee (sometimes easier to do than with your leg fully straight). Motion:  Perform gentle knee motion as tolerated - this is gentle bending and straightening of the knee. Seated heel slides: you can start by sitting in a chair, remove your brace, and gently slide your heel back on the floor - allowing your knee to bend. Have someone help you straighten your knee (or use your other leg/foot hooked under your ankle.  Dressing:  Perform 1st dressing change at 3 days postoperative. A moderate amount of blood tinged drainage is to be expected.  So if you bleed through the dressing on the first or second day or if you have fevers, it is fine to change the dressing/check the wounds early and redress wound. Elevate your leg.  If it bleeds through again, or if the incisions are leaking frank blood, please call the office. May  change dressing every 1-2 days thereafter to help watch wounds. Can purchase Tegderm (or 23M Nexcare) water resistant dressings at local pharmacy / [employer]. Shower:   shower is ok after 3 days.  Please take shower, NO bath. Recover with gauze and ace wrap to help keep wounds protected.   Pain medication:  A narcotic pain medication has been prescribed.  Take as directed.  Typically you need narcotic pain medication more regularly during the first 3 to 5 days after surgery.  Decrease your use of the medication as the pain improves.  Narcotics can sometimes cause constipation, even after a few doses.  If you have problems with constipation, you can take an over the counter stool softener or light laxative.  If you have persistent problems, please notify your physician's office. Physical therapy: Additional activity guidelines to be provided by your physician or physical therapist at follow-up visits.  Driving:  Should not drive while taking narcotic pain medications. It typically takes at least 2 weeks to restore sufficient neuromuscular function for normal reaction times for driving safety.  Call [phone] for questions or problems. Evenings you will be forwarded to the hospital operator.  Ask for the orthopaedic physician on call. Please call if you experience:    Redness, foul smelling, or persistent drainage from the surgical site  worsening knee pain and swelling not responsive to medication  any calf pain and or swelling of the lower  leg  temperatures greater than 101.5 F other questions or concerns   Thank you for allowing [place] to be a part of your care     Post Anesthesia Home Care Instructions  Activity: Get plenty of rest for the remainder of the day. A responsible individual must stay with you for 24 hours following the procedure.  For the next 24 hours, DO NOT: -Drive a car -Advertising copywriter -Drink alcoholic beverages -Take any medication unless instructed by your  physician -Make any legal decisions or sign important papers.  Meals: Start with liquid foods such as gelatin or soup. Progress to regular foods as tolerated. Avoid greasy, spicy, heavy foods. If nausea and/or vomiting occur, drink only clear liquids until the nausea and/or vomiting subsides. Call your physician if vomiting continues.  Special Instructions/Symptoms: Your throat may feel dry or sore from the anesthesia or the breathing tube placed in your throat during surgery. If this causes discomfort, gargle with warm salt water. The discomfort should disappear within 24 hours.  May take Tylenol after 8:30pm if needed.

## 2022-12-27 NOTE — Anesthesia Postprocedure Evaluation (Signed)
Anesthesia Post Note  Patient: Samantha Mcdaniel  Procedure(s) Performed: KNEE ARTHROSCOPY WITH PARTIAL  MEDIAL MENISECTOMY (Left: Knee)     Patient location during evaluation: PACU Anesthesia Type: General Level of consciousness: awake and alert, oriented and patient cooperative Pain management: pain level controlled Vital Signs Assessment: post-procedure vital signs reviewed and stable Respiratory status: spontaneous breathing, nonlabored ventilation and respiratory function stable Cardiovascular status: blood pressure returned to baseline and stable Postop Assessment: no apparent nausea or vomiting Anesthetic complications: no   No notable events documented.  Last Vitals:  Vitals:   12/27/22 1308 12/27/22 1315  BP:  99/87  Pulse:  74  Resp:  17  Temp:    SpO2: 99% 97%    Last Pain:  Vitals:   12/27/22 1312  TempSrc:   PainSc: Asleep   Pain Goal: Patients Stated Pain Goal: 3 (12/27/22 1300)  LLE Motor Response: Purposeful movement, Responds to commands (12/27/22 1300) LLE Sensation: Full sensation (12/27/22 1300)            Lannie Fields

## 2022-12-30 ENCOUNTER — Encounter (HOSPITAL_BASED_OUTPATIENT_CLINIC_OR_DEPARTMENT_OTHER): Payer: Self-pay | Admitting: Orthopedic Surgery
